# Patient Record
Sex: Male | Born: 1985 | Race: White | Hispanic: No | Marital: Single | Smoking: Current every day smoker
Health system: Southern US, Community
[De-identification: ages and names within clinical notes are randomized; demographics above are authoritative.]

## PROBLEM LIST (undated history)

## (undated) DIAGNOSIS — B1920 Unspecified viral hepatitis C without hepatic coma: Secondary | ICD-10-CM

## (undated) HISTORY — DX: Unspecified viral hepatitis C without hepatic coma: B19.20

---

## 2020-08-07 ENCOUNTER — Emergency Department: Payer: Medicaid Other

## 2020-08-07 ENCOUNTER — Encounter: Payer: Self-pay | Admitting: Emergency Medicine

## 2020-08-07 ENCOUNTER — Other Ambulatory Visit: Payer: Self-pay

## 2020-08-07 ENCOUNTER — Emergency Department
Admission: EM | Admit: 2020-08-07 | Discharge: 2020-08-07 | Disposition: A | Discharge status: Home / Self Care | Payer: Medicaid Other | Attending: Emergency Medicine | Admitting: Emergency Medicine

## 2020-08-07 DIAGNOSIS — Z20822 Contact with and (suspected) exposure to covid-19: Secondary | ICD-10-CM | POA: Insufficient documentation

## 2020-08-07 DIAGNOSIS — F410 Panic disorder [episodic paroxysmal anxiety] without agoraphobia: Secondary | ICD-10-CM | POA: Insufficient documentation

## 2020-08-07 DIAGNOSIS — F17200 Nicotine dependence, unspecified, uncomplicated: Secondary | ICD-10-CM | POA: Insufficient documentation

## 2020-08-07 LAB — TROPONIN I (HIGH SENSITIVITY): Troponin I (High Sensitivity): 4 ng/L (ref <18)

## 2020-08-07 LAB — BASIC METABOLIC PANEL
Anion gap: 11 (ref 5–15)
BUN: 13 mg/dL (ref 6–20)
CO2: 25 mmol/L (ref 22–32)
Calcium: 9.4 mg/dL (ref 8.9–10.3)
Chloride: 101 mmol/L (ref 98–111)
Creatinine, Ser: 0.68 mg/dL (ref 0.61–1.24)
GFR, Estimated: >60 mL/min (ref >60)
Glucose, Bld: 119 mg/dL — ABNORMAL HIGH (ref 70–99)
Potassium: 3.7 mmol/L (ref 3.5–5.1)
Sodium: 137 mmol/L (ref 135–145)

## 2020-08-07 LAB — CBC
HCT: 43.3 % (ref 39.0–52.0)
Hemoglobin: 15.3 g/dL (ref 13.0–17.0)
MCH: 32.8 pg (ref 26.0–34.0)
MCHC: 35.3 g/dL (ref 30.0–36.0)
MCV: 92.9 fL (ref 80.0–100.0)
Platelets: 217 10*3/uL (ref 150–400)
RBC: 4.66 MIL/uL (ref 4.22–5.81)
RDW: 12.4 % (ref 11.5–15.5)
WBC: 8.7 10*3/uL (ref 4.0–10.5)
nRBC: 0 % (ref 0.0–0.2)

## 2020-08-07 LAB — RESP PANEL BY RT-PCR (FLU A&B, COVID) ARPGX2
Influenza A by PCR: NEGATIVE
Influenza B by PCR: NEGATIVE
SARS Coronavirus 2 by RT PCR: NEGATIVE

## 2020-08-07 MED ORDER — LORAZEPAM 0.5 MG PO TABS
0.5000 mg | ORAL_TABLET | Freq: Once | ORAL | Status: AC
  Administered 2020-08-07: 16:00:00 0.5 mg via ORAL
  Filled 2020-08-07: qty 1

## 2020-08-07 NOTE — ED Triage Notes (Signed)
Pt to ED via [facility], pt states numbness/tingling that started in his chest and then spread to bilateral arms and jaw. Pt states that numbness has resolved at this time. Pt A&O x4. Pt states that his hands locked up, pt able to move all fingers without difficulty at this time.

## 2020-08-07 NOTE — ED Provider Notes (Signed)
[facility] Emergency Department Provider Note  ____________________________________________   Event Date/Time   First MD Initiated Contact with Patient 08/07/20 1554     (approximate)  I have reviewed the triage vital signs and the nursing notes.   HISTORY  Chief Complaint Dizziness and Numbness (/)    HPI Ernest Orr is a 34 y.o. male presents emergency department stating he had a sudden onset of numbness and tingling that started in his chest and spread out and made his hands turning to call us.  States it did radiate up into the jaw when his lips got numb.  He states now he feels better and does not feel that sensation but it just really scared him.  He has history of hep C.  Denies illicit drugs prior to the event happening.  He denies chest pain at this time.  No history of anxiety.    Past Medical History:  Diagnosis Date  . Hepatitis C     There are no problems to display for this patient.   History reviewed. No pertinent surgical history.  Prior to Admission medications   Not on File    Allergies Penicillins  No family history on file.  Social History Social History   Tobacco Use  . Smoking status: Current Every Day Smoker  . Smokeless tobacco: Never Used  Substance Use Topics  . Alcohol use: Yes  . Drug use: Yes    Types: Marijuana    Comment: Daily    Review of Systems  Constitutional: No fever/chills Eyes: No visual changes. ENT: No sore throat. Respiratory: Denies cough Cardiovascular: Positive chest pain/numbness Gastrointestinal: Denies abdominal pain Genitourinary: Negative for dysuria. Musculoskeletal: Negative for back pain. Skin: Negative for rash. Psychiatric: no mood changes,     ____________________________________________   PHYSICAL EXAM:  VITAL SIGNS: ED Triage Vitals  Enc Vitals Group     BP 08/07/20 1238 (!) 138/98     Pulse Rate 08/07/20 1238 83     Resp 08/07/20 1238 20     Temp  08/07/20 1238 99.2 F (37.3 C)     Temp Source 08/07/20 1238 Oral     SpO2 08/07/20 1238 99 %     Weight 08/07/20 1256 165 lb (74.8 kg)     Height 08/07/20 1256 5\' 11"  (1.803 m)     Head Circumference --      Peak Flow --      Pain Score 08/07/20 1256 0     Pain Loc --      Pain Edu? --      Excl. in GC? --     Constitutional: Alert and oriented. Well appearing and in no acute distress.  Patient appears to be a little nervous Eyes: Conjunctivae are normal.  Head: Atraumatic. Nose: No congestion/rhinnorhea. Mouth/Throat: Mucous membranes are moist.   Neck:  supple no lymphadenopathy noted Cardiovascular: Normal rate, regular rhythm. Heart sounds are normal Respiratory: Normal respiratory effort.  No retractions, lungs c t a  Abd: soft nontender bs normal all 4 quad GU: deferred Musculoskeletal: FROM all extremities, warm and well perfused Neurologic:  Normal speech and language.  Skin:  Skin is warm, dry and intact. No rash noted. Psychiatric: Mood and affect are normal. Speech and behavior are normal.  ____________________________________________   LABS (all labs ordered are listed, but only abnormal results are displayed)  Labs Reviewed  BASIC METABOLIC PANEL - Abnormal; Notable for the following components:      Result Value  Glucose, Bld 119 (*)    All other components within normal limits  RESP PANEL BY RT-PCR (FLU A&B, COVID) ARPGX2  CBC  TROPONIN I (HIGH SENSITIVITY)   ____________________________________________   ____________________________________________  RADIOLOGY  Chest x-ray  ____________________________________________   PROCEDURES  Procedure(s) performed: EKG   Procedures    ____________________________________________   INITIAL IMPRESSION / ASSESSMENT AND PLAN / ED COURSE  Pertinent labs & imaging results that were available during my care of the patient were reviewed by me and considered in my medical decision making (see chart  for details).   Patient is 34 year old male presents to the emergency department following a panic attack.  See HPI.  Physical exam is unremarkable.  DDx: Anxiety, panic attack, MI, stroke  CBC, basic metabolic panel and troponin is normal Chest x-ray is normal, image was reviewed by me and the radiologist report confirms  EKG shows sinus bradycardia  Did discuss all of the findings with patient.  He is still a little nervous.  We will give him Ativan to see if this helps calm him down.  Covid/flu test ordered.  Anticipate he will be discharged    ----------------------------------------- 4:46 PM on 08/07/2020 -----------------------------------------  Patient states he feels better and does not have chest pain.  He seems to be more relaxed since the Ativan.  Explained to him we can call him with his Covid results.  He is agreeable to this and would like a work note.  He was discharged stable condition.  Ernest Orr was evaluated in Emergency Department on 08/07/2020 for the symptoms described in the history of present illness. He was evaluated in the context of the global COVID-19 pandemic, which necessitated consideration that the patient might be at risk for infection with the SARS-CoV-2 virus that causes COVID-19. Institutional protocols and algorithms that pertain to the evaluation of patients at risk for COVID-19 are in a state of rapid change based on information released by regulatory bodies including the CDC and federal and state organizations. These policies and algorithms were followed during the patient's care in the ED.    As part of my medical decision making, I reviewed the following data within the electronic MEDICAL RECORD NUMBER Nursing notes reviewed and incorporated, Labs reviewed , EKG interpreted bradycardia, see physician read, Old chart reviewed, Radiograph reviewed , Notes from prior ED visits and [place] Controlled Substance  Database  ____________________________________________   FINAL CLINICAL IMPRESSION(S) / ED DIAGNOSES  Final diagnoses:  Panic attack      NEW MEDICATIONS STARTED DURING THIS VISIT:  New Prescriptions   No medications on file     Note:  This document was prepared using Dragon voice recognition software and may include unintentional dictation errors.    Faythe Ghee, PA-C 08/07/20 1648    Jene Every, MD 08/07/20 [place]

## 2020-08-07 NOTE — Discharge Instructions (Signed)
Follow-up with your regular doctor if not improving in 2 to 3 days.  Return emergency department worsening. We will call you with your Covid result

## 2020-08-07 NOTE — ED Notes (Addendum)
Pt alert and sitting calmly in bed. Skin dry. Resp reg/unlabored. Denies CP and anxiety currently. Denies nausea.

## 2020-08-07 NOTE — ED Triage Notes (Signed)
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 Per EMS stroke screen negative at this time.      130/60 60-75Hr 100% RA

## 2022-03-27 IMAGING — CR DG CHEST 2V
1 series · 2 of 2 positions shown · non-contrast
Comparison: None.

CLINICAL DATA: Acute onset upper extremity numbness

EXAM:
CHEST - 2 VIEW

[Series 1: dg chest 2 view · 0.14mm/px · 2 of 2 slices shown]
[im 1/2]
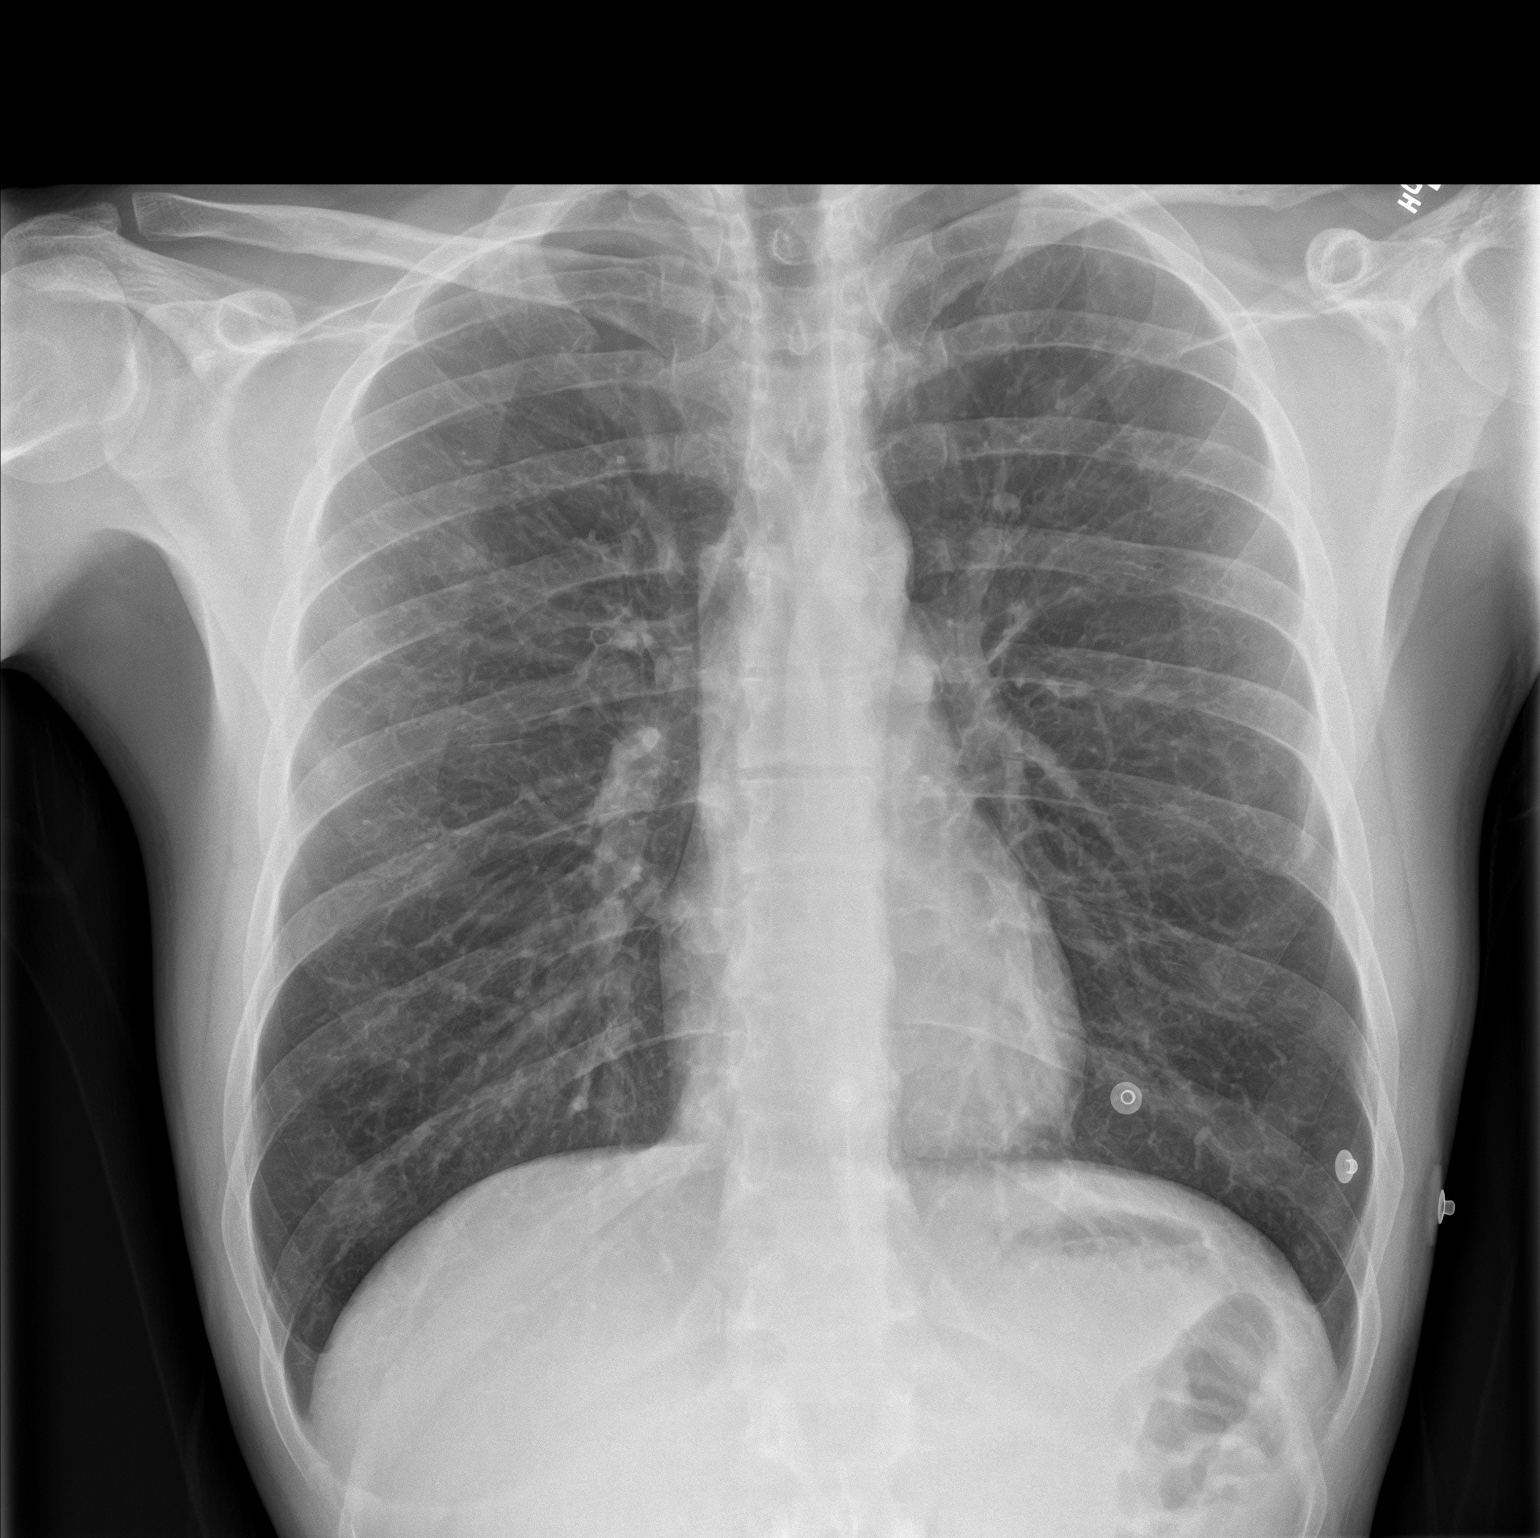
[im 2/2]
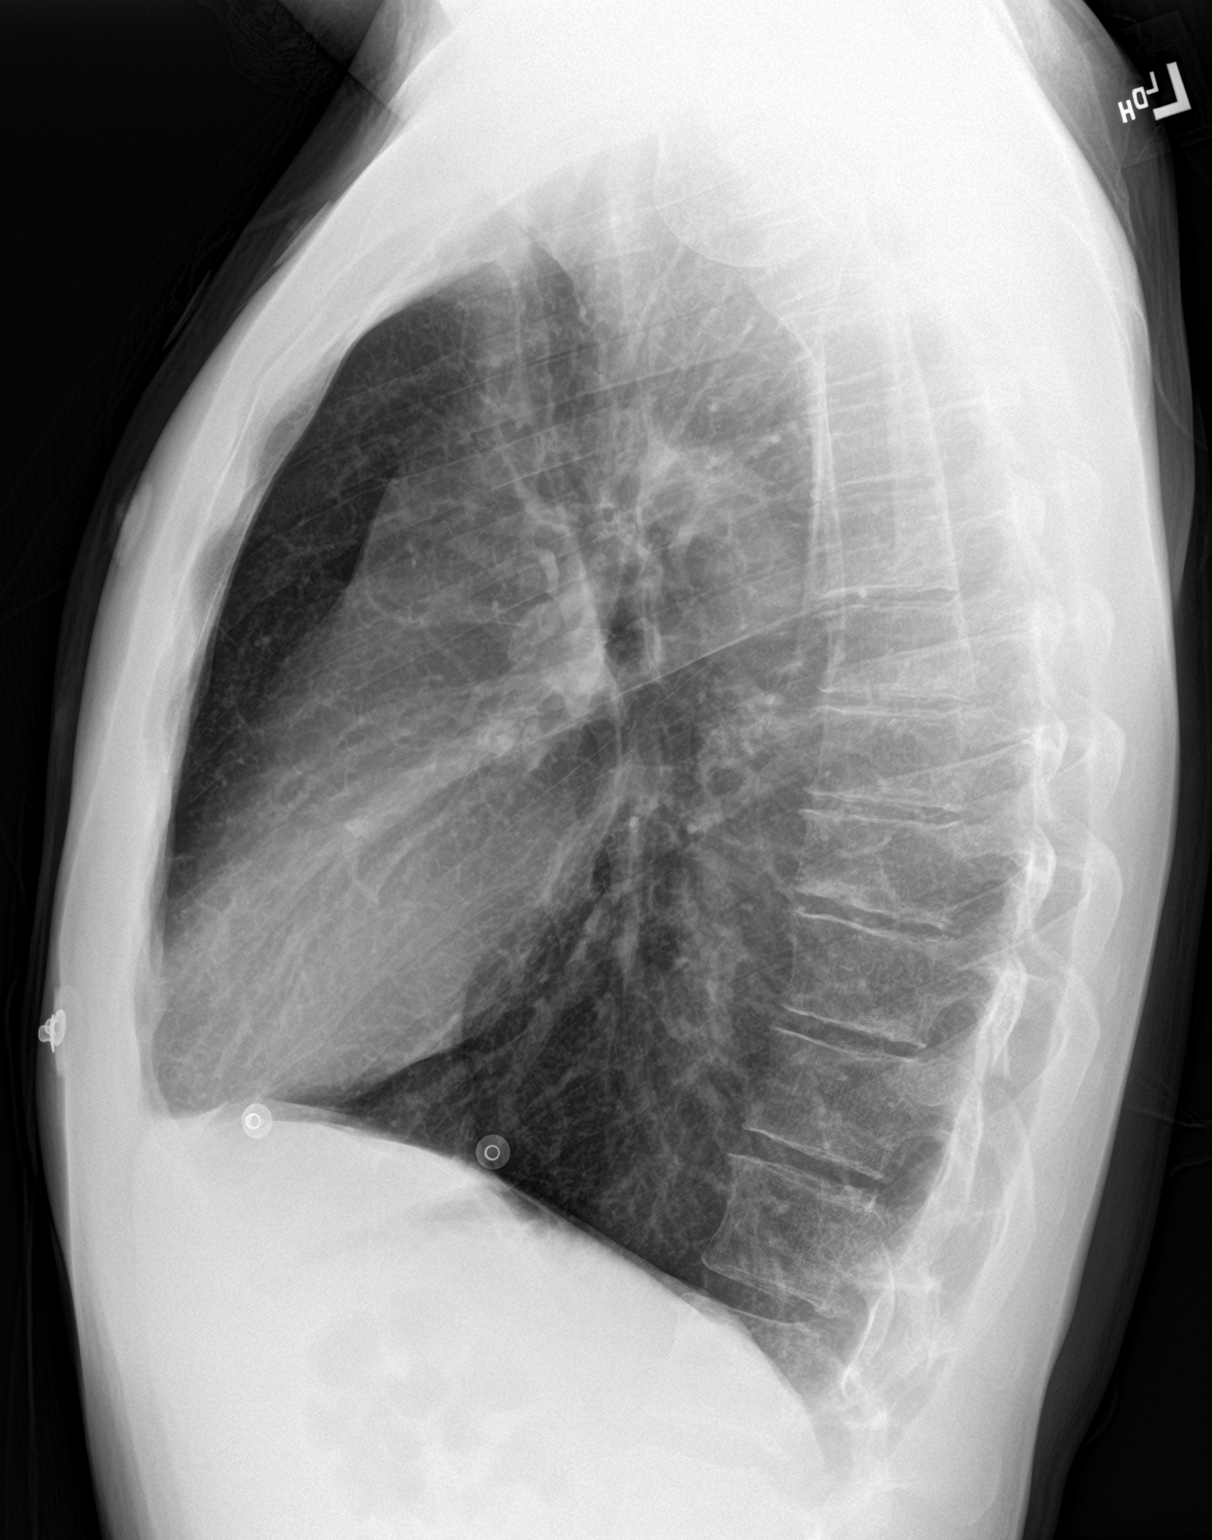

[2 of 2 positions shown; findings below may reference images not displayed]

FINDINGS: Lungs are clear. Heart size and pulmonary vascularity are normal. No
adenopathy. No bone lesions.
IMPRESSION: Lungs clear.  Cardiac silhouette normal.
